# Patient Record
Sex: Female | Born: 1995 | Race: Black or African American | Hispanic: No | Marital: Single | Smoking: Never smoker
Health system: Southern US, Community
[De-identification: ages and names within clinical notes are randomized; demographics above are authoritative.]

## PROBLEM LIST (undated history)

## (undated) ENCOUNTER — Inpatient Hospital Stay: Payer: Self-pay

## (undated) DIAGNOSIS — F329 Major depressive disorder, single episode, unspecified: Secondary | ICD-10-CM

## (undated) DIAGNOSIS — F32A Depression, unspecified: Secondary | ICD-10-CM

---

## 2017-03-06 ENCOUNTER — Emergency Department: Payer: Self-pay

## 2017-03-06 ENCOUNTER — Inpatient Hospital Stay
Admission: EM | Admit: 2017-03-06 | Discharge: 2017-03-07 | Disposition: A | Discharge status: Home / Self Care | Payer: Self-pay | Attending: Obstetrics and Gynecology | Admitting: Obstetrics and Gynecology

## 2017-03-06 ENCOUNTER — Encounter: Payer: Self-pay | Admitting: Emergency Medicine

## 2017-03-06 DIAGNOSIS — Z3A19 19 weeks gestation of pregnancy: Secondary | ICD-10-CM | POA: Insufficient documentation

## 2017-03-06 DIAGNOSIS — N200 Calculus of kidney: Secondary | ICD-10-CM | POA: Insufficient documentation

## 2017-03-06 DIAGNOSIS — O26832 Pregnancy related renal disease, second trimester: Secondary | ICD-10-CM | POA: Insufficient documentation

## 2017-03-06 DIAGNOSIS — O24912 Unspecified diabetes mellitus in pregnancy, second trimester: Secondary | ICD-10-CM | POA: Insufficient documentation

## 2017-03-06 DIAGNOSIS — O26892 Other specified pregnancy related conditions, second trimester: Secondary | ICD-10-CM | POA: Insufficient documentation

## 2017-03-06 DIAGNOSIS — O26899 Other specified pregnancy related conditions, unspecified trimester: Secondary | ICD-10-CM

## 2017-03-06 DIAGNOSIS — Z87442 Personal history of urinary calculi: Secondary | ICD-10-CM | POA: Insufficient documentation

## 2017-03-06 DIAGNOSIS — O209 Hemorrhage in early pregnancy, unspecified: Secondary | ICD-10-CM | POA: Insufficient documentation

## 2017-03-06 DIAGNOSIS — O2342 Unspecified infection of urinary tract in pregnancy, second trimester: Secondary | ICD-10-CM | POA: Insufficient documentation

## 2017-03-06 DIAGNOSIS — R109 Unspecified abdominal pain: Secondary | ICD-10-CM

## 2017-03-06 LAB — HCG, QUANTITATIVE, PREGNANCY: HCG, BETA CHAIN, QUANT, S: 29485 m[IU]/mL — AB (ref <5)

## 2017-03-06 LAB — POCT PREGNANCY, URINE: PREG TEST UR: POSITIVE — AB

## 2017-03-06 LAB — CBC
HEMATOCRIT: 29.9 % — AB (ref 35.0–47.0)
Hemoglobin: 10.9 g/dL — ABNORMAL LOW (ref 12.0–16.0)
MCH: 31.6 pg (ref 26.0–34.0)
MCHC: 36.6 g/dL — AB (ref 32.0–36.0)
MCV: 86.4 fL (ref 80.0–100.0)
Platelets: 291 10*3/uL (ref 150–440)
RBC: 3.46 MIL/uL — ABNORMAL LOW (ref 3.80–5.20)
RDW: 15.8 % — AB (ref 11.5–14.5)
WBC: 11.2 10*3/uL — ABNORMAL HIGH (ref 3.6–11.0)

## 2017-03-06 LAB — ABO/RH: ABO/RH(D): A POS

## 2017-03-06 NOTE — ED Triage Notes (Signed)
Patient brought back from US

## 2017-03-06 NOTE — ED Triage Notes (Signed)
Pt G2P1. Unsure of how far along pt is. Just found out pregnant earlier this month.  Pt last period end January; per pregnancy wheel would be 25-26 weeks.  Pt reports US earlier this month put her at 16+ weeks.  One day vaginal bleeding last week and again today.  C/o abdominal pain last 3 days and bright red bleeding today.  Reports more than spotting and will have to hold tissue there when starts bleeding.  Spoke with L&D and they want US down here to verify how far along pregnancy is before coming up

## 2017-03-06 NOTE — ED Notes (Signed)
Pt now informs RN that she has not had sex before march this year and that she has a 498 month old baby at home.  Spoke with US and they will do US to make sure baby is not viable. No FHT at this time per dr Lenard Lancepaduchowski since will get US quickly.

## 2017-03-06 NOTE — ED Notes (Signed)
Called patient for treatment area without an answer. Called on five separate occasions without an answer.

## 2017-03-07 LAB — URINALYSIS, ROUTINE W REFLEX MICROSCOPIC
Bilirubin Urine: NEGATIVE
GLUCOSE, UA: NEGATIVE mg/dL
Hgb urine dipstick: NEGATIVE
Ketones, ur: NEGATIVE mg/dL
NITRITE: NEGATIVE
PH: 5 (ref 5.0–8.0)
Protein, ur: NEGATIVE mg/dL
Specific Gravity, Urine: 1.032 — ABNORMAL HIGH (ref 1.005–1.030)

## 2017-03-07 LAB — CHLAMYDIA/NGC RT PCR (ARMC ONLY)
Chlamydia Tr: NOT DETECTED
N GONORRHOEAE: NOT DETECTED

## 2017-03-07 NOTE — H&P (Signed)
Nichole Mcguire is a 21 y.o. female [id number]  with 1 prior US visit in [place] with EDD of  08/03/17  prior to moving here to [place] on 02/20/17  presenting for "soreness in the abdominal area starting on Thursday" and concerned that this is not normal. Pt has not had PNC at this point as she just got her Medicaid number this week but, no card yet. Pt denies any VB, LOF, UC's, back pain or any dysuria, urinary frequency, vaginal odor. Pt states she had "some vag spotting a week ago during urination" but, nothing since that time.  HPI: Starting Thurs, pt began to experience abd pain below the umbilicus and above the pubic hair line bilat. Rates the pain a 6/7 out of 10. Pt denies any recent STD S/S but, has had chlamydia x 1 in the past. No N,V,D,constipation. Has been eating and taking fluids like normal. Pain does not radiate anywhere and is localized bilat but, no lateralization. Pt has been afebrile and has not required any meds for the pain. No S/S of labor noted.   PMH: Kidney stone x 1, Chlamydia x 1 which was treated in the past AVW:UJWJPSH:None. Family History: family history includes Crohn's disease in her mother; Hypertension in her mother. Social History:  reports that she has never smoked. She has never used smokeless tobacco. She reports that she does not drink alcohol or use drugs. OB/GYN:G3P1011, 1 prior NSVD in [place](child is now 2 yrs old)   Maternal Diabetes: Has not been evaluated this pregnancy Genetic Screening: None to date Maternal Ultrasounds/Referrals: 03/06/17 showed: Mason JimSingleton, +cardiac activity 157, Ant placenta, no previa, AFI WNL, XB:JYNWGNCx:closed on US BPD: 4.3 cms @19  weeks, no ovarian measurements taken.  Fetal Ultrasounds or other Referrals: Per above Maternal Substance Abuse: Denies per pt Significant Maternal Medications: PNV Significant Maternal Lab Results: Urine: Ca oxylate crystals present, +mucus present,Hazy appeanrance, Rare bacteria, Neg bilirubin, Neg glucose, Neg Ketones, Mod  leukocytes, Neg nitrites, ph5, Protein neg, SG:1.032, WBC6-30  Other Comments:    Review of Systems  Constitutional: Negative.   HENT: Negative.   Eyes: Negative.   Respiratory: Negative.   Cardiovascular: Negative.   Musculoskeletal: Negative.   Skin: Negative.    History Blood pressure (!) 117/57, pulse 87, temperature 98.1 F (36.7 C), temperature source Oral, resp. rate 18, height 5\' 6"  (1.676 m), weight 65.3 kg (144 lb), last menstrual period 09/03/2016, SpO2 100 %. Exam Physical Exam  Gen: 21 yo black female in NAD. HEENT:Normocephalic, Eyes non-icteric,  Heart:S1S2, RRR, NO M/R/G. Lungs: CTA bilat, no W/R/R. Abd: FH is 18 cms, +Doppler 157 on US Pt in non-tender over the upper abd and the lower abd. There is no rigidity, no distention, Abd is soft and the only area of discomfort is where the baby is lying in the uterus. Pt does not react if you deeply palpate the areas but, states that is where is is sore. CVAT is neg bilaterally.  Extrems: No edema, Reflexes 1+/0. Vag exam deferred as pt has not had any LOF or VB this am and cx was closed on US. A:1. IUP at 19 weeks 2. Abdominal discomfort 3. UTI has not been ruled out 4. Calcium oxylate in the urine P:1. GC/CH sent. 2.Push po fluids 3. [facility] card. 4. FU here for wosening of S/S. Prenatal labs: ABO, Rh: --/--/A POS (07/28 1902) Antibody:  Unknown  Rubella:  Unknown  RPR:   Unknown  HBsAg:   Uknown  HIV:   Unknown  GBS:   Unknown    Sharee Pimplearon W Jones 03/07/2017, 3:22 AM

## 2017-03-07 NOTE — Progress Notes (Signed)
Pt arrived to [facility] triage unit in no acute pain, states she came to hospital to be evaluated for lower abdominal pain she has been having for the past 3 days. Says she is not currently having any vaginal bleeding or spotting. Reports having small amt bleeding like a period when she wiped after urinating, yesterday and last week. Denies pressure or back pain. Says she had not noticed any clots when bleeding was present. At this moment, pt insist she is here for lower abdominal pain and discomfort, rates 4/10, but tolerable. Pt denies taking any medication or doing anything to relieve discomfort. [place] and found out she was pregnant 2 days before she moved, [facility] [place], [facility]. LMP was at end of Jan, lasted 3 days. Exact start date unknown. Pt says she has not began to feel fetal movement yet. FHR doppler/tracing 155. Toco applied, abdomen palpates soft, no ctx tracing. Pt unable to provide urine sample, will obtain when next she needs to void.

## 2017-03-07 NOTE — Discharge Instructions (Signed)
Dietary Guidelines to Help Prevent Kidney Stones Kidney stones are deposits of minerals and salts that form inside your kidneys. Your risk of developing kidney stones may be greater depending on your diet, your lifestyle, the medicines you take, and whether you have certain medical conditions. Most people can reduce their chances of developing kidney stones by following the instructions below. Depending on your overall health and the type of kidney stones you tend to develop, your dietitian may give you more specific instructions. What are tips for following this plan? Reading food labels   Choose foods with "no salt added" or "low-salt" labels. Limit your sodium intake to less than 1500 mg per day.  Choose foods with calcium for each meal and snack. Try to eat about 300 mg of calcium at each meal. Foods that contain 200-500 mg of calcium per serving include:  8 oz (237 ml) of milk, fortified nondairy milk, and fortified fruit juice.  8 oz (237 ml) of kefir, yogurt, and soy yogurt.  4 oz (118 ml) of tofu.  1 oz of cheese.  1 cup (300 g) of dried figs.  1 cup (91 g) of cooked broccoli.  1-3 oz can of sardines or mackerel.  Most people need 1000 to 1500 mg of calcium each day. Talk to your dietitian about how much calcium is recommended for you. Shopping   Buy plenty of fresh fruits and vegetables. Most people do not need to avoid fruits and vegetables, even if they contain nutrients that may contribute to kidney stones.  When shopping for convenience foods, choose:  Whole pieces of fruit.  Premade salads with dressing on the side.  Low-fat fruit and yogurt smoothies.  Avoid buying frozen meals or prepared deli foods.  Look for foods with live cultures, such as yogurt and kefir. Cooking   Do not add salt to food when cooking. Place a salt shaker on the table and allow each person to add his or her own salt to taste.  Use vegetable protein, such as beans, textured vegetable  protein (TVP), or tofu instead of meat in pasta, casseroles, and soups. Meal planning   Eat less salt, if told by your dietitian. To do this:  Avoid eating processed or premade food.  Avoid eating fast food.  Eat less animal protein, including cheese, meat, poultry, or fish, if told by your dietitian. To do this:  Limit the number of times you have meat, poultry, fish, or cheese each week. Eat a diet free of meat at least 2 days a week.  Eat only one serving each day of meat, poultry, fish, or seafood.  When you prepare animal protein, cut pieces into small portion sizes. For most meat and fish, one serving is about the size of one deck of cards.  Eat at least 5 servings of fresh fruits and vegetables each day. To do this:  Keep fruits and vegetables on hand for snacks.  Eat 1 piece of fruit or a handful of berries with breakfast.  Have a salad and fruit at lunch.  Have two kinds of vegetables at dinner.  Limit foods that are high in a substance called oxalate. These include:  Spinach.  Rhubarb.  Beets.  Potato chips and french fries.  Nuts.  If you regularly take a diuretic medicine, make sure to eat at least 1-2 fruits or vegetables high in potassium each day. These include:  Avocado.  Banana.  Orange, prune, carrot, or tomato juice.  Baked potato.  Cabbage.    Beans and split peas. General instructions   Drink enough fluid to keep your urine clear or pale yellow. This is the most important thing you can do.  Talk to your health care provider and dietitian about taking daily supplements. Depending on your health and the cause of your kidney stones, you may be advised:  Not to take supplements with vitamin C.  To take a calcium supplement.  To take a daily probiotic supplement.  To take other supplements such as magnesium, fish oil, or vitamin B6.  Take all medicines and supplements as told by your health care provider.  Limit alcohol intake to no  more than 1 drink a day for nonpregnant women and 2 drinks a day for men. One drink equals 12 oz of beer, 5 oz of wine, or 1 oz of hard liquor.  Lose weight if told by your health care provider. Work with your dietitian to find strategies and an eating plan that works best for you. What foods are not recommended? Limit your intake of the following foods, or as told by your dietitian. Talk to your dietitian about specific foods you should avoid based on the type of kidney stones and your overall health. Grains  Breads. Bagels. Rolls. Baked goods. Salted crackers. Cereal. Pasta. Vegetables  Spinach. Rhubarb. Beets. Canned vegetables. Pickles. Olives. Meats and other protein foods  Nuts. Nut butters. Large portions of meat, poultry, or fish. Salted or cured meats. Deli meats. Hot dogs. Sausages. Dairy  Cheese. Beverages  Regular soft drinks. Regular vegetable juice. Seasonings and other foods  Seasoning blends with salt. Salad dressings. Canned soups. Soy sauce. Ketchup. Barbecue sauce. Canned pasta sauce. Casseroles. Pizza. Lasagna. Frozen meals. Potato chips. French fries. Summary  You can reduce your risk of kidney stones by making changes to your diet.  The most important thing you can do is drink enough fluid. You should drink enough fluid to keep your urine clear or pale yellow.  Ask your health care provider or dietitian how much protein from animal sources you should eat each day, and also how much salt and calcium you should have each day. This information is not intended to replace advice given to you by your health care provider. Make sure you discuss any questions you have with your health care provider. Document Released: 11/21/2010 Document Revised: 07/07/2016 Document Reviewed: 07/07/2016 Elsevier Interactive Patient Education  2017 Elsevier Inc.  

## 2017-03-07 NOTE — Progress Notes (Signed)
Beatriz Stallion. Jones, CNM at bedside to see and assess pt.

## 2017-03-07 NOTE — Discharge Summary (Signed)
Obstetric Discharge Summary Reason for Admission: Abdominal pain  Prenatal Procedures: none Intrapartum Procedures: N/A Postpartum Procedures: none Complications-Operative and Postpartum: N/A Hemoglobin  Date Value Ref Range Status  03/06/2017 10.9 (L) 12.0 - 16.0 g/dL Final   HCT  Date Value Ref Range Status  03/06/2017 29.9 (L) 35.0 - 47.0 % Final    Physical Exam:  General: alert, cooperative and appears stated age 15Lochia: None Uterine Fundus: Gravid Incision: N/A Neg Homans  Discharge Diagnoses: IUP at 19 weeks with kidney stones  Discharge Information: Date: 03/07/2017 Activity: Up ad lib Diet: diet modification to include kidney stones and pushing po water Medications: PNV Condition: Stable  Instructions[facility] Discharge to: Home   Call [phone] On Monday for an appointment with Nichole Mcguire, CNM (Midwife) and set up a new OB appt. Tell them that she has already been seen by us at the hospital and that we will take her for care.  Newborn Data: This patient has no babies on file.   Nichole Mcguire 03/07/2017, 4:23 AM

## 2017-03-07 NOTE — OB Triage Note (Signed)
Drink plenty fluids, rest, continue taking prenatal vitamins, avoid heavy lifting or strenuous activities. Scheduled an appointment with OBGYN to be seen as soon as has medicaid card. Follow recommend diet to prevent kidney stones. Return to nearest hospital emergency department if having any heavy vaginal bleeding, clots, worsening lower abdominal pain, pressure or cramping. Understanding verbalized.

## 2017-03-07 NOTE — Progress Notes (Signed)
Nichole Mcguire. Jones, CNM paged, call returned, made aware of pt arrival to BP with c/o lower abd pain, urine sample sent for UA, reviewed results, Yetta Barre. Jones, CNM to come see pt. No additional orders received.

## 2017-04-20 LAB — OB RESULTS CONSOLE HIV ANTIBODY (ROUTINE TESTING): HIV: NONREACTIVE

## 2017-04-21 LAB — OB RESULTS CONSOLE VARICELLA ZOSTER ANTIBODY, IGG: VARICELLA IGG: IMMUNE

## 2017-04-21 LAB — OB RESULTS CONSOLE RPR: RPR: NONREACTIVE

## 2017-04-21 LAB — OB RESULTS CONSOLE HEPATITIS B SURFACE ANTIGEN: HEP B S AG: NEGATIVE

## 2017-04-21 LAB — OB RESULTS CONSOLE GC/CHLAMYDIA
Chlamydia: NEGATIVE
Gonorrhea: NEGATIVE

## 2017-04-21 LAB — OB RESULTS CONSOLE RUBELLA ANTIBODY, IGM: RUBELLA: IMMUNE

## 2017-07-12 LAB — OB RESULTS CONSOLE GBS: STREP GROUP B AG: NEGATIVE

## 2017-07-13 LAB — OB RESULTS CONSOLE HIV ANTIBODY (ROUTINE TESTING): HIV: NONREACTIVE

## 2017-07-15 LAB — OB RESULTS CONSOLE GC/CHLAMYDIA
Chlamydia: NEGATIVE
Gonorrhea: NEGATIVE

## 2017-08-05 ENCOUNTER — Other Ambulatory Visit: Payer: Self-pay | Admitting: Obstetrics and Gynecology

## 2017-08-10 ENCOUNTER — Inpatient Hospital Stay
Admission: EM | Admit: 2017-08-10 | Discharge: 2017-08-12 | DRG: 806 | Disposition: A | Discharge status: Home / Self Care | Payer: Medicaid Other | Attending: Obstetrics & Gynecology | Admitting: Obstetrics & Gynecology

## 2017-08-10 ENCOUNTER — Other Ambulatory Visit: Payer: Self-pay

## 2017-08-10 DIAGNOSIS — D649 Anemia, unspecified: Secondary | ICD-10-CM | POA: Diagnosis present

## 2017-08-10 DIAGNOSIS — O9902 Anemia complicating childbirth: Secondary | ICD-10-CM | POA: Diagnosis present

## 2017-08-10 DIAGNOSIS — Z3A41 41 weeks gestation of pregnancy: Secondary | ICD-10-CM | POA: Diagnosis not present

## 2017-08-10 DIAGNOSIS — Z3483 Encounter for supervision of other normal pregnancy, third trimester: Secondary | ICD-10-CM | POA: Diagnosis present

## 2017-08-10 LAB — CBC
HEMATOCRIT: 31.1 % — AB (ref 35.0–47.0)
HEMOGLOBIN: 11.2 g/dL — AB (ref 12.0–16.0)
MCH: 32.9 pg (ref 26.0–34.0)
MCHC: 36.2 g/dL — AB (ref 32.0–36.0)
MCV: 90.9 fL (ref 80.0–100.0)
Platelets: 281 10*3/uL (ref 150–440)
RBC: 3.42 MIL/uL — AB (ref 3.80–5.20)
RDW: 16.8 % — ABNORMAL HIGH (ref 11.5–14.5)
WBC: 15.1 10*3/uL — ABNORMAL HIGH (ref 3.6–11.0)

## 2017-08-10 LAB — COMPREHENSIVE METABOLIC PANEL
ALBUMIN: 3.3 g/dL — AB (ref 3.5–5.0)
ALK PHOS: 74 U/L (ref 38–126)
ALT: 16 U/L (ref 14–54)
ANION GAP: 10 (ref 5–15)
AST: 25 U/L (ref 15–41)
BILIRUBIN TOTAL: 4 mg/dL — AB (ref 0.3–1.2)
BUN: 9 mg/dL (ref 6–20)
CALCIUM: 8.8 mg/dL — AB (ref 8.9–10.3)
CO2: 20 mmol/L — AB (ref 22–32)
CREATININE: 0.61 mg/dL (ref 0.44–1.00)
Chloride: 108 mmol/L (ref 101–111)
GFR calc Af Amer: >60 mL/min (ref >60)
GFR calc non Af Amer: >60 mL/min (ref >60)
GLUCOSE: 71 mg/dL (ref 65–99)
Potassium: 3.5 mmol/L (ref 3.5–5.1)
SODIUM: 138 mmol/L (ref 135–145)
TOTAL PROTEIN: 6.9 g/dL (ref 6.5–8.1)

## 2017-08-10 LAB — TYPE AND SCREEN
ABO/RH(D): A POS
Antibody Screen: NEGATIVE

## 2017-08-10 MED ORDER — SOD CITRATE-CITRIC ACID 500-334 MG/5ML PO SOLN: 30.0000 mL | ORAL | Status: DC | PRN

## 2017-08-10 MED ORDER — TERBUTALINE SULFATE 1 MG/ML IJ SOLN: 0.2500 mg | Freq: Once | INTRAMUSCULAR | Status: DC | PRN

## 2017-08-10 MED ORDER — MISOPROSTOL 25 MCG QUARTER TABLET
25.0000 ug | ORAL_TABLET | ORAL | Status: DC
  Administered 2017-08-10: 25 ug via ORAL
  Filled 2017-08-10 (×2): qty 1

## 2017-08-10 MED ORDER — LIDOCAINE HCL (PF) 1 % IJ SOLN: 30.0000 mL | INTRAMUSCULAR | Status: DC | PRN

## 2017-08-10 MED ORDER — LACTATED RINGERS IV SOLN
500.0000 mL | INTRAVENOUS | Status: DC | PRN
  Administered 2017-08-11: 250 mL via INTRAVENOUS

## 2017-08-10 MED ORDER — OXYTOCIN BOLUS FROM INFUSION
500.0000 mL | Freq: Once | INTRAVENOUS | Status: AC
  Administered 2017-08-11: 500 mL via INTRAVENOUS

## 2017-08-10 MED ORDER — ONDANSETRON HCL 4 MG/2ML IJ SOLN
4.0000 mg | Freq: Four times a day (QID) | INTRAMUSCULAR | Status: DC | PRN
  Administered 2017-08-11: 4 mg via INTRAVENOUS
  Filled 2017-08-10 (×2): qty 2

## 2017-08-10 MED ORDER — SODIUM CHLORIDE 0.9 % IV SOLN: 2.0000 g | Freq: Once | INTRAVENOUS | Status: DC

## 2017-08-10 MED ORDER — OXYTOCIN 40 UNITS IN LACTATED RINGERS INFUSION - SIMPLE MED
2.5000 [IU]/h | INTRAVENOUS | Status: DC
  Administered 2017-08-11: 2.5 [IU]/h via INTRAVENOUS
  Filled 2017-08-10: qty 1000

## 2017-08-10 MED ORDER — LACTATED RINGERS IV SOLN
INTRAVENOUS | Status: DC
  Administered 2017-08-10 – 2017-08-11 (×3): via INTRAVENOUS

## 2017-08-10 MED ORDER — MISOPROSTOL 25 MCG QUARTER TABLET
25.0000 ug | ORAL_TABLET | ORAL | Status: DC | PRN
  Filled 2017-08-10: qty 1

## 2017-08-10 MED ORDER — BUTORPHANOL TARTRATE 1 MG/ML IJ SOLN: 1.0000 mg | INTRAMUSCULAR | Status: DC | PRN

## 2017-08-10 MED ORDER — OXYTOCIN 40 UNITS IN LACTATED RINGERS INFUSION - SIMPLE MED
1.0000 m[IU]/min | INTRAVENOUS | Status: DC
  Administered 2017-08-11: 2 m[IU]/min via INTRAVENOUS

## 2017-08-10 MED ORDER — ACETAMINOPHEN 325 MG PO TABS
650.0000 mg | ORAL_TABLET | ORAL | Status: DC | PRN
  Administered 2017-08-11 – 2017-08-12 (×2): 650 mg via ORAL
  Filled 2017-08-10 (×2): qty 2

## 2017-08-10 NOTE — L&D Delivery Note (Signed)
Date of delivery: 08/11/17 Estimated Date of Delivery: 08/03/17 Patient's last menstrual period was 09/03/2016. EGA: 267w1d  Delivery Note At 11:32 AM a viable female was delivered via Vaginal, Spontaneous (Presentation: LOA, cephalic  ).  APGAR: 7, 9; weight 8 lb 8.5 oz (3870 g).   Placenta status: spontaneous, intact.  Cord: 3vv with the following complications:  None apparent.  Cord pH: not sent  Anesthesia:  epidural Episiotomy: None Lacerations: None Suture Repair: n'a Est. Blood Loss (mL): 755cc measured  Mom to postpartum.  Baby to Couplet care / Skin to Skin.  Mom presented to L&D with contractions, but low bishop score   cytotec placed x 1. SrOM for meconium. epidual placed. Augmented with pitocin. Recurrent deep variables noted and Amnioinfusion performed. Progressed to complete, second stage: <30 mins.  delivery of fetal head with restitution to LOT.   Anterior then posterior shoulders delivered without difficulty.  Baby placed on mom's chest, and attended to by peds.  We sang happy birthday to baby Nichole Mcguire.  Cord was then clamped and cut after <60sec.   Placenta spontaneously delivered, intact.   IV pitocin given for hemorrhage prophylaxis. Brisk bleeding encountered, determined to be from uterus; no vaginal tears, cervix was intact.  Methergine IM and Cytotec given (200 buccally, 800 rectally). Bleeding slowed to expected rate.    ----- Ranae Plumberhelsea Ward, MD Attending Obstetrician and Gynecologist [facility], Department of OB/GYN [facility]

## 2017-08-10 NOTE — H&P (Signed)
Update H&P  Patient presents with painful contractions, due to be induced tomorrow   2/50/-3 per RN  Fhr: 150-170 mod + accels + small short vriabkes, category 2 Toco: q 3-5 min  BP 114/72 (BP Location: Left Arm)   Pulse 79   Temp (!) 97.4 F (36.3 C) (Oral)   Resp 16   Ht 5\' 6"  (1.676 m)   Wt 82.1 kg (181 lb)   LMP 09/03/2016   BMI 29.21 kg/m   -cytotec 25mcg buccally  q4h prn -pitocin after cytotec prn -will confirm GBS status  If positive, will start abx  -admit to L&D   Please refer to Andris BaumannNM Jones' already written H&P for details

## 2017-08-10 NOTE — OB Triage Note (Signed)
Pt states CTX started 08/09/18 and have become more intense. Pt states some spotting but denies LOF. Positive Fetal movement. Fetal monitors applied assessing.

## 2017-08-10 NOTE — Progress Notes (Signed)
Spoke with provider, Dr. Elesa MassedWard, at [phone] with patient report. Reported patient contraction pattern, tachy heart rate from the 170s to 180s, with variable and deceleration @1939 . Ask provider to review strip and discussed plan of care. Will continue to monitor and update provider as needed.

## 2017-08-11 ENCOUNTER — Inpatient Hospital Stay: Payer: Medicaid Other | Admitting: Anesthesiology

## 2017-08-11 MED ORDER — MISOPROSTOL 200 MCG PO TABS
ORAL_TABLET | ORAL | Status: AC
  Filled 2017-08-11: qty 1

## 2017-08-11 MED ORDER — DIPHENHYDRAMINE HCL 25 MG PO CAPS: 25.0000 mg | ORAL_CAPSULE | Freq: Four times a day (QID) | ORAL | Status: DC | PRN

## 2017-08-11 MED ORDER — PHENYLEPHRINE 40 MCG/ML (10ML) SYRINGE FOR IV PUSH (FOR BLOOD PRESSURE SUPPORT)
80.0000 ug | PREFILLED_SYRINGE | INTRAVENOUS | Status: DC | PRN
  Filled 2017-08-11: qty 5

## 2017-08-11 MED ORDER — FENTANYL 2.5 MCG/ML W/ROPIVACAINE 0.15% IN NS 100 ML EPIDURAL (ARMC)
EPIDURAL | Status: AC
  Filled 2017-08-11: qty 100

## 2017-08-11 MED ORDER — MISOPROSTOL 200 MCG PO TABS
200.0000 ug | ORAL_TABLET | Freq: Once | ORAL | Status: AC
  Administered 2017-08-11: 200 ug via BUCCAL

## 2017-08-11 MED ORDER — COCONUT OIL OIL: 1.0000 "application " | TOPICAL_OIL | Status: DC | PRN

## 2017-08-11 MED ORDER — DOCUSATE SODIUM 100 MG PO CAPS
100.0000 mg | ORAL_CAPSULE | Freq: Two times a day (BID) | ORAL | Status: DC
  Administered 2017-08-12 (×2): 100 mg via ORAL
  Filled 2017-08-11 (×2): qty 1

## 2017-08-11 MED ORDER — DIPHENHYDRAMINE HCL 50 MG/ML IJ SOLN: 12.5000 mg | INTRAMUSCULAR | Status: DC | PRN

## 2017-08-11 MED ORDER — ONDANSETRON HCL 4 MG/2ML IJ SOLN: 4.0000 mg | INTRAMUSCULAR | Status: DC | PRN

## 2017-08-11 MED ORDER — SODIUM CHLORIDE 0.9 % IV SOLN
INTRAVENOUS | Status: DC | PRN
  Administered 2017-08-11 (×3): 5 mL via EPIDURAL

## 2017-08-11 MED ORDER — WITCH HAZEL-GLYCERIN EX PADS: 1.0000 "application " | MEDICATED_PAD | CUTANEOUS | Status: DC

## 2017-08-11 MED ORDER — LACTATED RINGERS IV SOLN: 500.0000 mL | Freq: Once | INTRAVENOUS | Status: DC

## 2017-08-11 MED ORDER — BENZOCAINE-MENTHOL 20-0.5 % EX AERO: 1.0000 "application " | INHALATION_SPRAY | CUTANEOUS | Status: DC | PRN

## 2017-08-11 MED ORDER — LIDOCAINE HCL (PF) 1 % IJ SOLN
INTRAMUSCULAR | Status: AC
  Filled 2017-08-11: qty 30

## 2017-08-11 MED ORDER — EPHEDRINE 5 MG/ML INJ
10.0000 mg | INTRAVENOUS | Status: DC | PRN
  Filled 2017-08-11: qty 2

## 2017-08-11 MED ORDER — IBUPROFEN 600 MG PO TABS
600.0000 mg | ORAL_TABLET | Freq: Four times a day (QID) | ORAL | Status: DC
  Administered 2017-08-11 – 2017-08-12 (×3): 600 mg via ORAL
  Filled 2017-08-11 (×3): qty 1

## 2017-08-11 MED ORDER — LACTATED RINGERS IV SOLN
INTRAVENOUS | Status: DC
  Administered 2017-08-11: 06:00:00 via INTRAUTERINE

## 2017-08-11 MED ORDER — OXYTOCIN 10 UNIT/ML IJ SOLN
INTRAMUSCULAR | Status: AC
  Filled 2017-08-11: qty 2

## 2017-08-11 MED ORDER — PRENATAL MULTIVITAMIN CH
1.0000 | ORAL_TABLET | Freq: Every day | ORAL | Status: DC
  Administered 2017-08-11 – 2017-08-12 (×2): 1 via ORAL
  Filled 2017-08-11 (×2): qty 1

## 2017-08-11 MED ORDER — ONDANSETRON HCL 4 MG PO TABS: 4.0000 mg | ORAL_TABLET | ORAL | Status: DC | PRN

## 2017-08-11 MED ORDER — AMMONIA AROMATIC IN INHA
RESPIRATORY_TRACT | Status: AC
  Filled 2017-08-11: qty 10

## 2017-08-11 MED ORDER — MISOPROSTOL 200 MCG PO TABS
ORAL_TABLET | ORAL | Status: AC
  Administered 2017-08-11: 800 ug
  Filled 2017-08-11: qty 4

## 2017-08-11 MED ORDER — ACETAMINOPHEN 500 MG PO TABS
1000.0000 mg | ORAL_TABLET | Freq: Four times a day (QID) | ORAL | Status: DC | PRN
  Administered 2017-08-11 – 2017-08-12 (×2): 1000 mg via ORAL
  Filled 2017-08-11 (×2): qty 2

## 2017-08-11 MED ORDER — METHYLERGONOVINE MALEATE 0.2 MG/ML IJ SOLN
INTRAMUSCULAR | Status: AC
  Administered 2017-08-11: 0.2 mg
  Filled 2017-08-11: qty 1

## 2017-08-11 MED ORDER — LIDOCAINE HCL (PF) 1 % IJ SOLN
INTRAMUSCULAR | Status: DC | PRN
  Administered 2017-08-11: 3 mL

## 2017-08-11 MED ORDER — SIMETHICONE 80 MG PO CHEW: 80.0000 mg | CHEWABLE_TABLET | ORAL | Status: DC | PRN

## 2017-08-11 MED ORDER — FENTANYL 2.5 MCG/ML W/ROPIVACAINE 0.15% IN NS 100 ML EPIDURAL (ARMC)
12.0000 mL/h | EPIDURAL | Status: DC
  Administered 2017-08-11 (×2): 12 mL/h via EPIDURAL

## 2017-08-11 MED ORDER — DIBUCAINE 1 % RE OINT: 1.0000 "application " | TOPICAL_OINTMENT | RECTAL | Status: DC | PRN

## 2017-08-11 NOTE — Progress Notes (Signed)
Intrapartum progress note  Variables improved with amnioinfusion.  Currently at 50cc, may increase PRN.  MVu average 161 over 10 mins, increase pitocin to 3mu

## 2017-08-11 NOTE — Discharge Summary (Signed)
Obstetrical Discharge Summary  Patient Name: Nichole Mcguire DOB: [birth year] MRN: [id number]  Date of Admission: 08/10/2017 Nichole Mcguire of Delivery: 08/11/17 Delivered by: Nichole Mcguire Date of Discharge: 08/12/2017  Primary OB: [facility]gu[facility] IHK:[id number]:Patient's last menstrual period was 09/03/2016. EDC Estimated Date of Delivery: 08/03/17 Gestational Age at Delivery: 5672w1d   Antepartum complications: Admitting Diagnosis:  Secondary Diagnosis: Patient Active Problem List   Diagnosis Date Noted  . Indication for care in labor and delivery, antepartum 08/10/2017    Augmentation: Pitocin and Cytotec Complications: None Intrapartum complications/course: variables with amnioinfusion Date of Delivery: 08/11/17 Delivered By: Nichole Mcguire Delivery Type: spontaneous vaginal delivery Anesthesia: epidural Placenta: spontaneous Laceration: none Episiotomy: none Newborn Data: Live born female  Birth Weight: 8 lb 8.5 oz (3870 g) APGAR: 7, 9  Newborn Delivery   Birth date/time:  08/11/2017 11:32:00 Delivery type:  Vaginal, Spontaneous       Postpartum Procedures: Methergine and Cytotec given at delivery for immediate PP bleeding.  Post partum course:  Patient had an uncomplicated postpartum course.  By time of discharge on PPD#1, her pain was controlled on oral pain medications; she had appropriate lochia and was ambulating, voiding without difficulty and tolerating regular diet.  She was deemed stable for discharge to home.    Discharge Physical Exam:  BP 104/65 (BP Location: Left Arm)   Pulse 74   Temp 97.6 F (36.4 C) (Oral)   Resp 18   Ht 5\' 6"  (1.676 m)   Wt 181 lb (82.1 kg)   LMP 09/03/2016   SpO2 100%   Breastfeeding? Unknown   BMI 29.21 kg/m   General: NAD CV: RRR Pulm: CTABL, nl effort ABD: s/nd/nt, fundus firm and below the umbilicus Lochia: small DVT Evaluation: LE non-ttp, no evidence of DVT on exam.  Hemoglobin  Date Value Ref Range Status  08/12/2017 9.6 (L) 12.0  - 16.0 g/dL Final   HCT  Date Value Ref Range Status  08/12/2017 27.3 (L) 35.0 - 47.0 % Final     Disposition: stable, discharge to home. Baby Feeding: formula Baby Disposition: home with mom  Rh Immune globulin given: n/a Rubella vaccine given: n/a Tdap vaccine given in AP or PP setting: AP 05/18/17 Flu vaccine given in AP or PP setting:  AP 05/18/17 Contraception: plans Nexplanon  Prenatal Labs: A Pos/Ab Neg/RPR non-reactive/Varicella Immune/Rubella Immune/ Hep B negative/HIV negative GBS negative/ G/C negative   Plan:  Nichole Mcguire was discharged to home in good condition. Follow-up appointment with delivering provider in 6 weeks.  Discharge Medications: Allergies as of 08/12/2017      Reactions   Other Anaphylaxis   Pineappple, mushroom, kiwi      Medication List    TAKE these medications   acetaminophen 500 MG tablet Commonly known as:  TYLENOL Take 2 tablets (1,000 mg total) by mouth every 6 (six) hours as needed (for pain scale < 4).   ibuprofen 600 MG tablet Commonly known as:  ADVIL,MOTRIN Take 1 tablet (600 mg total) by mouth every 6 (six) hours.   PRENATAL VITAMIN PO Take by mouth.         Signed: McVey, REBECCA A, CNM

## 2017-08-11 NOTE — Anesthesia Procedure Notes (Signed)
Epidural Patient location during procedure: OB Start time: 08/11/2017 1:03 AM End time: 08/11/2017 1:23 AM  Staffing Anesthesiologist: Alver FisherPenwarden, Takerra Lupinacci, MD Performed: anesthesiologist   Preanesthetic Checklist Completed: patient identified, site marked, surgical consent, pre-op evaluation, timeout performed, IV checked, risks and benefits discussed and monitors and equipment checked  Epidural Patient position: sitting Prep: ChloraPrep Patient monitoring: heart rate, continuous pulse ox and blood pressure Approach: midline Location: L3-L4 Injection technique: LOR saline  Needle:  Needle type: Tuohy  Needle gauge: 18 G Needle length: 9 cm and 9 Needle insertion depth: 6 cm Catheter type: closed end flexible Catheter size: 20 Guage Catheter at skin depth: 10 cm Test dose: negative (0.125% bupivacaine)  Assessment Events: blood not aspirated, injection not painful, no injection resistance, negative IV test and no paresthesia  Additional Notes   Patient tolerated the insertion well without complications.Reason for block:procedure for pain

## 2017-08-11 NOTE — Progress Notes (Signed)
Intrapartum progress note  S: a little nauseous, but comfortable, s/p epidural Intrapartum events thus far: -cytotec placed, buccally x1 -epidural placed at 1:30am -spontaneous rupture 2am -pitocin started 4:45am  O: BP (!) 111/57   Pulse (!) 102   Temp 98.5 F (36.9 C) (Oral)   Resp 16   Ht 5\' 6"  (1.676 m)   Wt 82.1 kg (181 lb)   LMP 09/03/2016   SpO2 100%   BMI 29.21 kg/m   SVE: 7/80/-1, bloody show, IUPC placed TOCO: q 2-3 min, pit at 2mu, mVu: 175 in first 10 min FHT: 150 mod with recurrent variables following contractions  Amnioinfusion started, 200cc bolus, 50cc gtt.  A/P: 21yo G2P1001 @ 41+1 with labor:  1. Stalled labor:  Augmenting with pitocin, will determine adequacy after 30 minutes of IUPC and titrate pitocin PRN. 2. Category 2 tracing: recurrent variables, following contractions.  Amnioinfusion started, will see if these are lates that have a cord component or if there is improvement with the added fluid.   3. Continue close observation.  ----- Ranae Plumberhelsea Ward, MD Attending Obstetrician and Gynecologist [facility], Department of OB/GYN [facility]

## 2017-08-11 NOTE — Anesthesia Preprocedure Evaluation (Signed)
Anesthesia Evaluation  Patient identified by MRN, date of birth, ID band Patient awake    Reviewed: Allergy & Precautions, NPO status , Patient's Chart, lab work & pertinent test results  History of Anesthesia Complications Negative for: history of anesthetic complications  Airway Mallampati: III  TM Distance: >3 FB Neck ROM: Full    Dental no notable dental hx.    Pulmonary neg pulmonary ROS, neg sleep apnea, neg COPD,    breath sounds clear to auscultation- rhonchi (-) wheezing      Cardiovascular Exercise Tolerance: Good (-) hypertension(-) CAD and (-) Past MI  Rhythm:Regular Rate:Normal - Systolic murmurs and - Diastolic murmurs    Neuro/Psych negative neurological ROS  negative psych ROS   GI/Hepatic negative GI ROS, Neg liver ROS,   Endo/Other  negative endocrine ROSneg diabetes  Renal/GU negative Renal ROS     Musculoskeletal negative musculoskeletal ROS (+)   Abdominal (+) - obese, Gravid abdomen  Peds  Hematology negative hematology ROS (+)   Anesthesia Other Findings   Reproductive/Obstetrics (+) Pregnancy                             Lab Results  Component Value Date   WBC 15.1 (H) 08/10/2017   HGB 11.2 (L) 08/10/2017   HCT 31.1 (L) 08/10/2017   MCV 90.9 08/10/2017   PLT 281 08/10/2017    Anesthesia Physical Anesthesia Plan  ASA: II  Anesthesia Plan: Epidural   Post-op Pain Management:    Induction:   PONV Risk Score and Plan: 2  Airway Management Planned:   Additional Equipment:   Intra-op Plan:   Post-operative Plan:   Informed Consent: I have reviewed the patients History and Physical, chart, labs and discussed the procedure including the risks, benefits and alternatives for the proposed anesthesia with the patient or authorized representative who has indicated his/her understanding and acceptance.     Plan Discussed with: CRNA and  Anesthesiologist  Anesthesia Plan Comments: (Plan for epidural for labor, discussed epidural vs spinal vs GA if need for csection)        Anesthesia Quick Evaluation

## 2017-08-12 LAB — CBC
HCT: 27.3 % — ABNORMAL LOW (ref 35.0–47.0)
HEMOGLOBIN: 9.6 g/dL — AB (ref 12.0–16.0)
MCH: 31.8 pg (ref 26.0–34.0)
MCHC: 35.1 g/dL (ref 32.0–36.0)
MCV: 90.6 fL (ref 80.0–100.0)
Platelets: 235 10*3/uL (ref 150–440)
RBC: 3.01 MIL/uL — ABNORMAL LOW (ref 3.80–5.20)
RDW: 17.3 % — AB (ref 11.5–14.5)
WBC: 17.6 10*3/uL — ABNORMAL HIGH (ref 3.6–11.0)

## 2017-08-12 LAB — RPR: RPR Ser Ql: NONREACTIVE

## 2017-08-12 MED ORDER — IBUPROFEN 600 MG PO TABS
600.0000 mg | ORAL_TABLET | Freq: Four times a day (QID) | ORAL | Status: DC
  Administered 2017-08-12 (×2): 600 mg via ORAL
  Filled 2017-08-12 (×2): qty 1

## 2017-08-12 MED ORDER — IBUPROFEN 600 MG PO TABS: 600.0000 mg | ORAL_TABLET | Freq: Four times a day (QID) | ORAL | 0 refills | Status: AC

## 2017-08-12 MED ORDER — ACETAMINOPHEN 500 MG PO TABS: 1000.0000 mg | ORAL_TABLET | Freq: Four times a day (QID) | ORAL | 0 refills | Status: AC | PRN

## 2017-08-12 NOTE — Final Progress Note (Signed)
Post Partum Day 1 Subjective: Doing well, no complaints.  Tolerating regular diet, pain with PO meds, voiding and ambulating without difficulty.  No CP SOB Fever,Chills, N/V or leg pain; denies nipple or breast pain no HA change of vision, RUQ/epigastric pain  Objective: BP 104/65 (BP Location: Left Arm)   Pulse 74   Temp 97.6 F (36.4 C) (Oral)   Resp 18   Ht 5\' 6"  (1.676 m)   Wt 181 lb (82.1 kg)   LMP 09/03/2016   SpO2 100%   Breastfeeding? Unknown   BMI 29.21 kg/m    Physical Exam:  General: NAD; afebrile since 1/2 at 1520 Breasts: soft/nontender, formula feeding CV: RRR Pulm: nl effort, CTABL Abdomen: soft, NT, BS x 4 Perineum: minimal edema, intact Lochia: moderate Uterine Fundus: fundus firm and 2 fb below umbilicus DVT Evaluation: no cords, ttp LEs   Recent Labs    08/10/17 1908 08/12/17 0543  HGB 11.2* 9.6*  HCT 31.1* 27.3*  WBC 15.1* 17.6*  PLT 281 235    Assessment/Plan: 22 y.o. G2P1002 postpartum day # 1  - Continue routine PP care - encouraged snug fitting bra and cabbage leaves for bottlefeeding.  - Discussed contraceptive options including implant, IUDs hormonal and non-hormonal, injection, pills/ring/patch, condoms, and NFP.- [facility]   - mild  anemia - hemodynamically stable and asymptomatic; continue PNV x 6 weeks.  - Immunization status:  all Imms up to date    Disposition: Desires Dc home today if infant will be DC.     Nichole Mcguire A, CNM 08/12/17 9:52 AM

## 2017-08-12 NOTE — Anesthesia Postprocedure Evaluation (Signed)
Anesthesia Post Note  Patient: Nichole Mcguire  Procedure(s) Performed: AN AD HOC LABOR EPIDURAL  Patient location during evaluation: Mother Baby Anesthesia Type: Epidural Level of consciousness: awake, awake and alert and oriented Pain management: pain level controlled Vital Signs Assessment: post-procedure vital signs reviewed and stable Respiratory status: spontaneous breathing Cardiovascular status: blood pressure returned to baseline Postop Assessment: no headache, no backache, no apparent nausea or vomiting and adequate PO intake Anesthetic complications: no     Last Vitals:  Vitals:   08/12/17 0058 08/12/17 0525  BP: 104/62 108/63  Pulse: 81 79  Resp: 17 16  Temp: 36.4 C (!) 36.4 C  SpO2: 100% 100%    Last Pain:  Vitals:   08/12/17 0525  TempSrc: Oral  PainSc:                  [name]

## 2017-08-12 NOTE — Progress Notes (Signed)
Reviewed all patients discharge instructions and handouts regarding postpartum bleeding, no intercourse for 6 weeks, signs and symptoms of mastitis and postpartum bleu's. Reviewed discharge instructions for newborn regarding proper cord care, how and when to bathe the newborn, nail care, proper way to take the baby's temperature, along with safe sleep. All questions have been answered at this time. Patient discharged via wheelchair with nurse tech.  

## 2017-11-11 ENCOUNTER — Emergency Department
Admission: EM | Admit: 2017-11-11 | Discharge: 2017-11-11 | Disposition: A | Discharge status: Home / Self Care | Payer: Medicaid Other | Attending: Emergency Medicine | Admitting: Emergency Medicine

## 2017-11-11 ENCOUNTER — Other Ambulatory Visit: Payer: Self-pay

## 2017-11-11 ENCOUNTER — Emergency Department: Payer: Medicaid Other

## 2017-11-11 ENCOUNTER — Encounter: Payer: Self-pay | Admitting: Emergency Medicine

## 2017-11-11 DIAGNOSIS — R1011 Right upper quadrant pain: Secondary | ICD-10-CM | POA: Diagnosis present

## 2017-11-11 DIAGNOSIS — K8020 Calculus of gallbladder without cholecystitis without obstruction: Secondary | ICD-10-CM

## 2017-11-11 DIAGNOSIS — Z79899 Other long term (current) drug therapy: Secondary | ICD-10-CM | POA: Insufficient documentation

## 2017-11-11 DIAGNOSIS — R1110 Vomiting, unspecified: Secondary | ICD-10-CM

## 2017-11-11 HISTORY — DX: Major depressive disorder, single episode, unspecified: F32.9

## 2017-11-11 HISTORY — DX: Depression, unspecified: F32.A

## 2017-11-11 LAB — COMPREHENSIVE METABOLIC PANEL
ALK PHOS: 53 U/L (ref 38–126)
ALT: 58 U/L — AB (ref 14–54)
AST: 71 U/L — ABNORMAL HIGH (ref 15–41)
Albumin: 3.5 g/dL (ref 3.5–5.0)
Anion gap: 6 (ref 5–15)
BUN: 9 mg/dL (ref 6–20)
CALCIUM: 8.2 mg/dL — AB (ref 8.9–10.3)
CHLORIDE: 112 mmol/L — AB (ref 101–111)
CO2: 25 mmol/L (ref 22–32)
CREATININE: 0.8 mg/dL (ref 0.44–1.00)
GFR calc Af Amer: >60 mL/min (ref >60)
Glucose, Bld: 100 mg/dL — ABNORMAL HIGH (ref 65–99)
Potassium: 2.9 mmol/L — ABNORMAL LOW (ref 3.5–5.1)
Sodium: 143 mmol/L (ref 135–145)
Total Bilirubin: 2.1 mg/dL — ABNORMAL HIGH (ref 0.3–1.2)
Total Protein: 6.4 g/dL — ABNORMAL LOW (ref 6.5–8.1)

## 2017-11-11 LAB — CBC WITH DIFFERENTIAL/PLATELET
BASOS ABS: 0.1 10*3/uL (ref 0–0.1)
Basophils Relative: 1 %
EOS PCT: 1 %
Eosinophils Absolute: 0.1 10*3/uL (ref 0–0.7)
HCT: 33.5 % — ABNORMAL LOW (ref 35.0–47.0)
HEMOGLOBIN: 12.2 g/dL (ref 12.0–16.0)
LYMPHS ABS: 2.1 10*3/uL (ref 1.0–3.6)
LYMPHS PCT: 24 %
MCH: 30.3 pg (ref 26.0–34.0)
MCHC: 36.3 g/dL — ABNORMAL HIGH (ref 32.0–36.0)
MCV: 83.3 fL (ref 80.0–100.0)
Monocytes Absolute: 0.5 10*3/uL (ref 0.2–0.9)
Monocytes Relative: 6 %
NEUTROS ABS: 6.1 10*3/uL (ref 1.4–6.5)
NEUTROS PCT: 68 %
PLATELETS: 236 10*3/uL (ref 150–440)
RBC: 4.02 MIL/uL (ref 3.80–5.20)
RDW: 15.2 % — ABNORMAL HIGH (ref 11.5–14.5)
WBC: 8.7 10*3/uL (ref 3.6–11.0)

## 2017-11-11 LAB — LIPASE, BLOOD: Lipase: 41 U/L (ref 11–51)

## 2017-11-11 MED ORDER — ONDANSETRON HCL 4 MG/2ML IJ SOLN
INTRAMUSCULAR | Status: AC
  Administered 2017-11-11: 4 mg via INTRAVENOUS
  Filled 2017-11-11: qty 2

## 2017-11-11 MED ORDER — POTASSIUM CHLORIDE 20 MEQ PO PACK
40.0000 meq | PACK | Freq: Two times a day (BID) | ORAL | Status: DC
  Administered 2017-11-11: 40 meq via ORAL

## 2017-11-11 MED ORDER — MORPHINE SULFATE (PF) 2 MG/ML IV SOLN
INTRAVENOUS | Status: AC
  Administered 2017-11-11: 2 mg via INTRAVENOUS
  Filled 2017-11-11: qty 1

## 2017-11-11 MED ORDER — MORPHINE SULFATE (PF) 4 MG/ML IV SOLN
4.0000 mg | Freq: Once | INTRAVENOUS | Status: AC
Start: 2017-11-11 — End: 2017-11-11
  Administered 2017-11-11: 4 mg via INTRAVENOUS

## 2017-11-11 MED ORDER — ONDANSETRON HCL 4 MG/2ML IJ SOLN
INTRAMUSCULAR | Status: AC
  Filled 2017-11-11: qty 2

## 2017-11-11 MED ORDER — OXYCODONE-ACETAMINOPHEN 5-325 MG PO TABS: 1.0000 | ORAL_TABLET | ORAL | 0 refills | Status: AC | PRN

## 2017-11-11 MED ORDER — MORPHINE SULFATE (PF) 4 MG/ML IV SOLN
INTRAVENOUS | Status: AC
  Filled 2017-11-11: qty 1

## 2017-11-11 MED ORDER — MORPHINE SULFATE (PF) 2 MG/ML IV SOLN
2.0000 mg | Freq: Once | INTRAVENOUS | Status: AC
  Administered 2017-11-11: 2 mg via INTRAVENOUS

## 2017-11-11 MED ORDER — SODIUM CHLORIDE 0.9 % IV BOLUS
1000.0000 mL | Freq: Once | INTRAVENOUS | Status: AC
  Administered 2017-11-11: 1000 mL via INTRAVENOUS

## 2017-11-11 MED ORDER — POTASSIUM CHLORIDE 20 MEQ PO PACK
PACK | ORAL | Status: AC
  Filled 2017-11-11: qty 2

## 2017-11-11 MED ORDER — ONDANSETRON 4 MG PO TBDP: 4.0000 mg | ORAL_TABLET | Freq: Three times a day (TID) | ORAL | 0 refills | Status: AC | PRN

## 2017-11-11 MED ORDER — ONDANSETRON HCL 4 MG/2ML IJ SOLN
4.0000 mg | Freq: Once | INTRAMUSCULAR | Status: AC
  Administered 2017-11-11: 4 mg via INTRAVENOUS

## 2017-11-11 NOTE — ED Notes (Signed)
Pt discharged home after verbalizing understanding of discharge instructions; nad noted. 

## 2017-11-11 NOTE — ED Notes (Signed)
Patient denies increased pain after eating and drinking. MD made aware.

## 2017-11-11 NOTE — ED Triage Notes (Signed)
Patient from home via [facility]. Reports she woke up with sudden onset RUQ abdominal pain and one episode of vomiting. Patient denies recent illness or being around anyone sick. Denies fever. Alert and oriented x4.

## 2017-11-11 NOTE — ED Provider Notes (Signed)
[facility] Emergency Department Provider Note   First MD Initiated Contact with Patient 11/11/17 [phone]     (approximate)  I have reviewed the triage vital signs and the nursing notes.   HISTORY  Chief Complaint Abdominal Pain    HPI Nichole Mcguire is a 22 y.o. female presents to the emergency department [facility] [place] with right upper quadrant abdominal pain with one episode of nonbloody emesis.  Patient denies any fever or diarrhea.  Patient denies any urinary symptoms.  States her current pain score is 8 out of 10.   Past Medical History:  Diagnosis Date  . Depression     Patient Active Problem List   Diagnosis Date Noted  . Indication for care in labor and delivery, antepartum 08/10/2017    History reviewed. No pertinent surgical history.  Prior to Admission medications   Medication Sig Start Date End Date Taking? Authorizing Provider  acetaminophen (TYLENOL) 500 MG tablet Take 2 tablets (1,000 mg total) by mouth every 6 (six) hours as needed (for pain scale < 4). 08/12/17   McVey, Prudencio Pairebecca A, CNM  ibuprofen (ADVIL,MOTRIN) 600 MG tablet Take 1 tablet (600 mg total) by mouth every 6 (six) hours. 08/12/17   McVey, Prudencio Pairebecca A, CNM  ondansetron (ZOFRAN ODT) 4 MG disintegrating tablet Take 1 tablet (4 mg total) by mouth every 8 (eight) hours as needed for nausea or vomiting. 11/11/17   Darci CurrentBrown, [place] N, MD  oxyCODONE-acetaminophen (PERCOCET) 5-325 MG tablet Take 1 tablet by mouth every 4 (four) hours as needed for severe pain. 11/11/17 11/11/18  Darci CurrentBrown, [place] N, MD  Prenatal Vit-Fe Fumarate-FA (PRENATAL VITAMIN PO) Take by mouth.    [provider]    Allergies Other  Family History  Problem Relation Age of Onset  . Hypertension Mother   . Crohn's disease Mother     Social History Social History   Tobacco Use  . Smoking status: Never Smoker  . Smokeless tobacco: Never Used  Substance Use Topics  . Alcohol use: No  . Drug use: No     Review of Systems Constitutional: No fever/chills Eyes: No visual changes. ENT: No sore throat. Cardiovascular: Denies chest pain. Respiratory: Denies shortness of breath. Gastrointestinal: Positive for abdominal pain and vomiting no diarrhea.  No constipation. Genitourinary: Negative for dysuria. Musculoskeletal: Negative for neck pain.  Negative for back pain. Integumentary: Negative for rash. Neurological: Negative for headaches, focal weakness or numbness.  ____________________________________________   PHYSICAL EXAM:  VITAL SIGNS: ED Triage Vitals  Enc Vitals Group     BP 11/11/17 0427 (!) 142/93     Pulse Rate 11/11/17 0427 68     Resp --      Temp 11/11/17 0430 (!) 97.5 F (36.4 C)     Temp Source 11/11/17 0430 Oral     SpO2 11/11/17 0427 100 %     Weight 11/11/17 0426 81.6 kg (180 lb)     Height 11/11/17 0426 1.676 m (5\' 6" )     Head Circumference --      Peak Flow --      Pain Score 11/11/17 0426 9     Pain Loc --      Pain Edu? --      Excl. in GC? --     Constitutional: Alert and oriented. Well appearing and in no acute distress. Eyes: Conjunctivae are normal.  Head: Atraumatic.ea. Mouth/Throat: Mucous membranes are moist.  Oropharynx non-erythematous. Neck: No stridor.  Cardiovascular: Normal rate, regular  rhythm. Good peripheral circulation. Grossly normal heart sounds. Respiratory: Normal respiratory effort.  No retractions. Lungs CTAB. Gastrointestinal: Right upper quadrant tenderness to palpation no distention.  Musculoskeletal: No lower extremity tenderness nor edema. No gross deformities of extremities. Neurologic:  Normal speech and language. No gross focal neurologic deficits are appreciated.  Skin:  Skin is warm, dry and intact. No rash noted. Psychiatric: Mood and affect are normal. Speech and behavior are normal.  ____________________________________________   LABS (all labs ordered are listed, but only abnormal results are  displayed)  Labs Reviewed  CBC WITH DIFFERENTIAL/PLATELET - Abnormal; Notable for the following components:      Result Value   HCT 33.5 (*)    MCHC 36.3 (*)    RDW 15.2 (*)    All other components within normal limits  COMPREHENSIVE METABOLIC PANEL - Abnormal; Notable for the following components:   Potassium 2.9 (*)    Chloride 112 (*)    Glucose, Bld 100 (*)    Calcium 8.2 (*)    Total Protein 6.4 (*)    AST 71 (*)    ALT 58 (*)    Total Bilirubin 2.1 (*)    All other components within normal limits  LIPASE, BLOOD     RADIOLOGY I, [place] Shanon Seawright, personally viewed and evaluated these images (plain radiographs) as part of my medical decision making, as well as reviewing the written report by the radiologist.  ED MD interpretation: Right upper quadrant ultrasound consistent with cholelithiasis  Official radiology report(s): No results found.    Procedures   ____________________________________________   INITIAL IMPRESSION / ASSESSMENT AND PLAN / ED COURSE  As part of my medical decision making, I reviewed the following data within the electronic MEDICAL RECORD NUMBER   22 year old female presenting with above-stated history and physical exam concerning for cholelithiasis versus cholecystitis.  As such right upper quadrant ultrasound was performed which revealed evidence of cholelithiasis.  Patient given IV morphine and Zofran in the emergency department complete resolution of pain and vomiting.  Spoke with the patient at length regarding management of cholelithiasis.  In addition patient was advised of warning signs that would warrant immediate return to the emergency department.  Patient will be prescribed Percocet and Zofran for home with recommendation for outpatient follow-up with general surgery ____________________________________________  FINAL CLINICAL IMPRESSION(S) / ED DIAGNOSES  Final diagnoses:  Vomiting  RUQ pain  Calculus of gallbladder without  cholecystitis without obstruction     MEDICATIONS GIVEN DURING THIS VISIT:  Medications  morphine 2 MG/ML injection 2 mg (2 mg Intravenous Given 11/11/17 0431)  ondansetron (ZOFRAN) injection 4 mg (4 mg Intravenous Given 11/11/17 0431)  sodium chloride 0.9 % bolus 1,000 mL (0 mLs Intravenous Stopped 11/11/17 0645)  morphine 4 MG/ML injection 4 mg (4 mg Intravenous Given 11/11/17 0536)     ED Discharge Orders        Ordered    oxyCODONE-acetaminophen (PERCOCET) 5-325 MG tablet  Every 4 hours PRN     11/11/17 0647    ondansetron (ZOFRAN ODT) 4 MG disintegrating tablet  Every 8 hours PRN     11/11/17 0647       Note:  This document was prepared using Dragon voice recognition software and may include unintentional dictation errors.    Darci Current, MD 11/13/17 [phone]

## 2017-11-11 NOTE — ED Notes (Signed)
Patient given crackers and ginger ale for PO challenge. Will continue to monitor for abdominal pain and N/V.

## 2017-11-17 ENCOUNTER — Ambulatory Visit: Payer: Self-pay | Admitting: Surgery

## 2017-11-22 ENCOUNTER — Ambulatory Visit: Payer: Self-pay | Admitting: Surgery

## 2018-04-06 ENCOUNTER — Encounter: Payer: Self-pay | Admitting: *Deleted

## 2019-01-03 IMAGING — US US OB LIMITED
1 series · 14 of 28 positions shown · non-contrast
Comparison: none

CLINICAL DATA: Pregnant patient with vaginal bleeding for 1 day.
Lower abdominal pain.

EXAM:
LIMITED OBSTETRIC ULTRASOUND

[Series 1: us ob limited · 0.25mm/px · 14 of 41 slices shown]
[im 2/41]
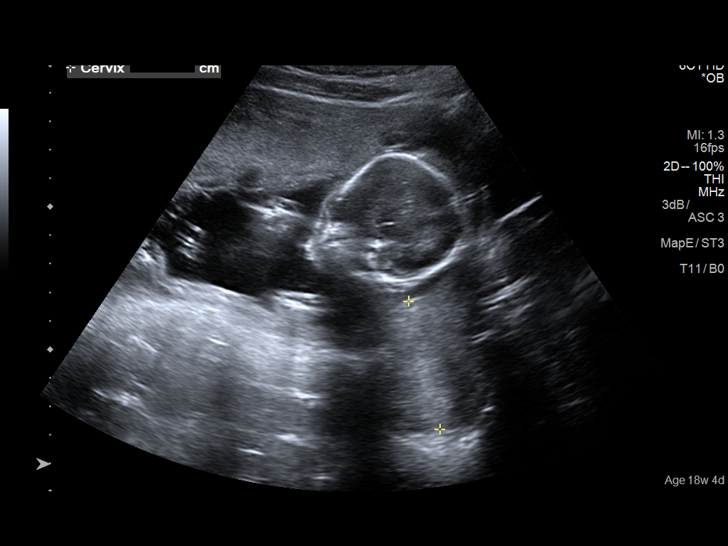
[im 5/41]
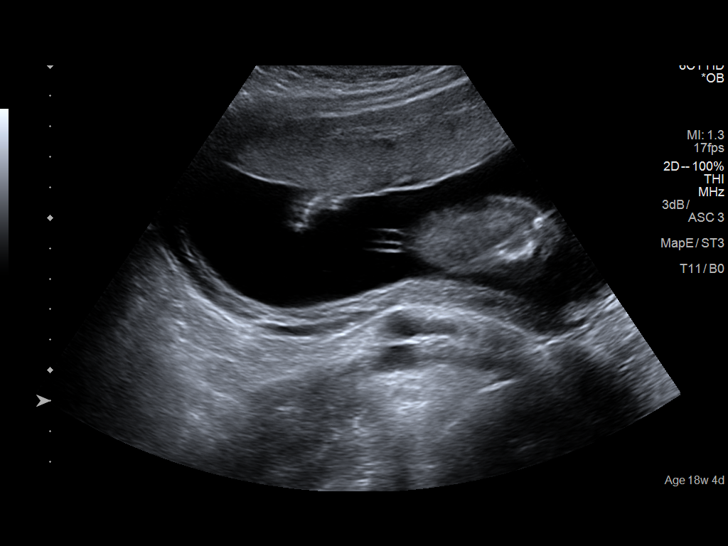
[im 8/41]
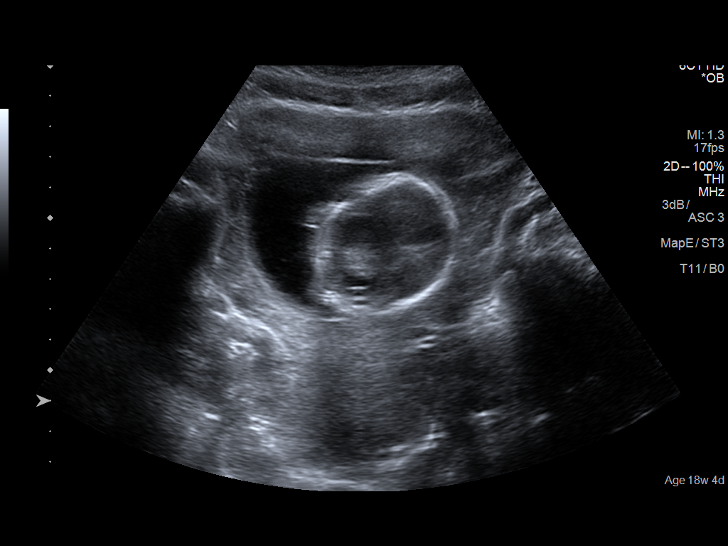
[im 11/41]
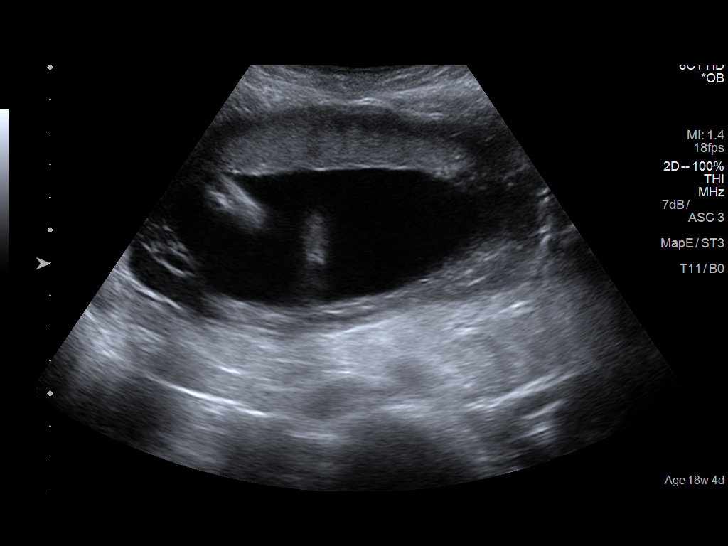
[im 14/41]
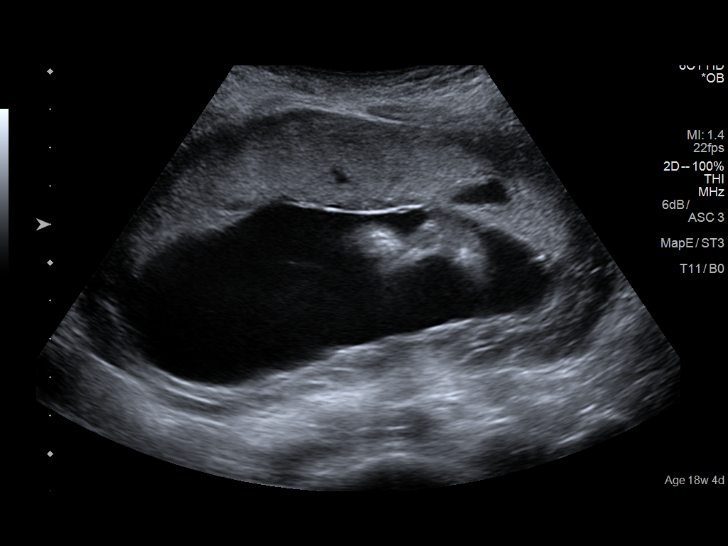
[im 17/41]
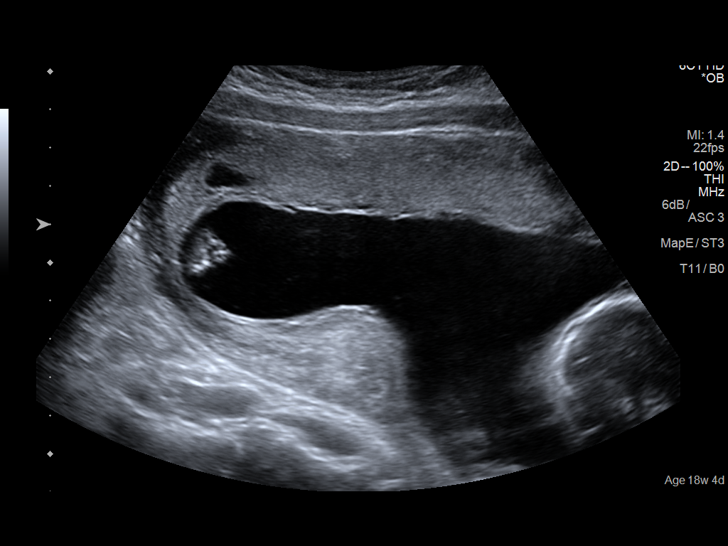
[im 20/41]
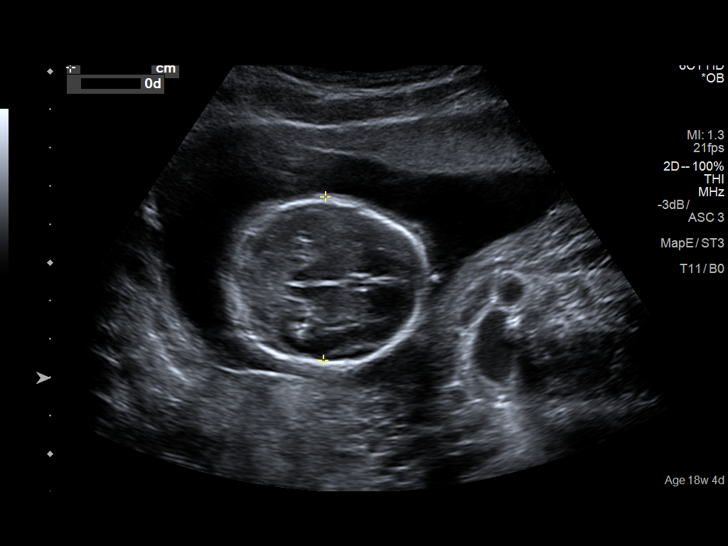
[im 23/41]
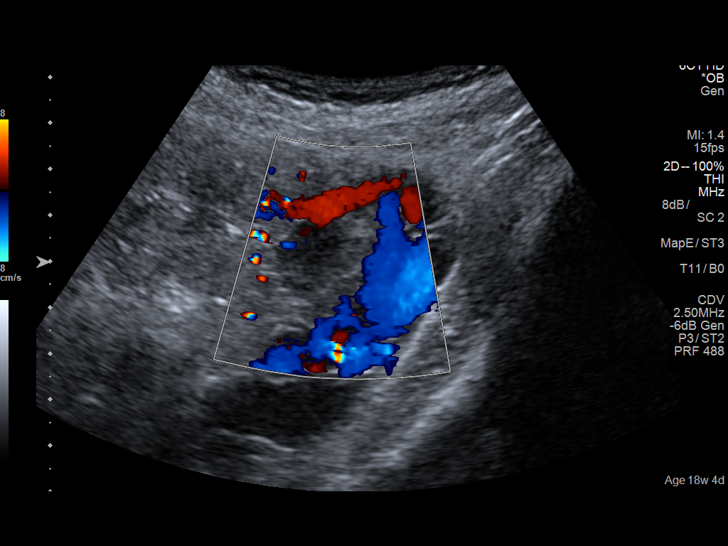
[im 26/41]
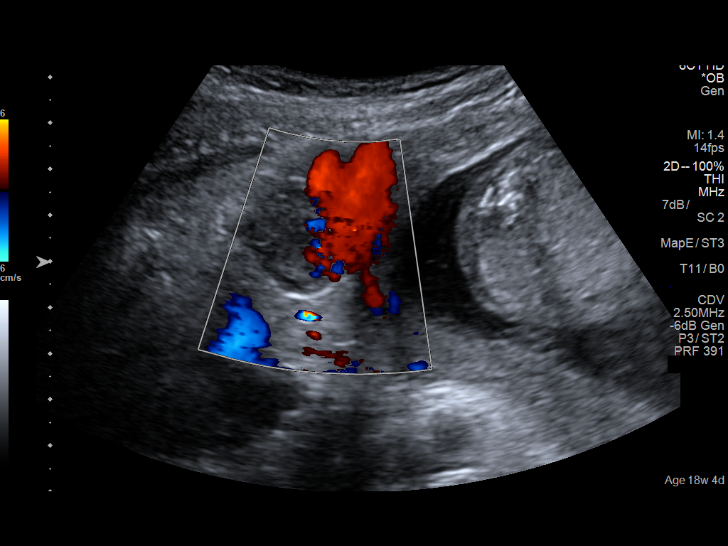
[im 29/41]
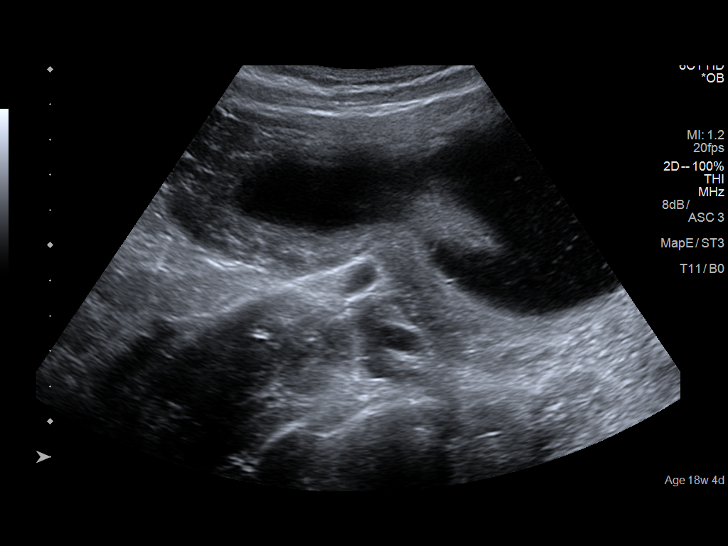
[im 32/41]
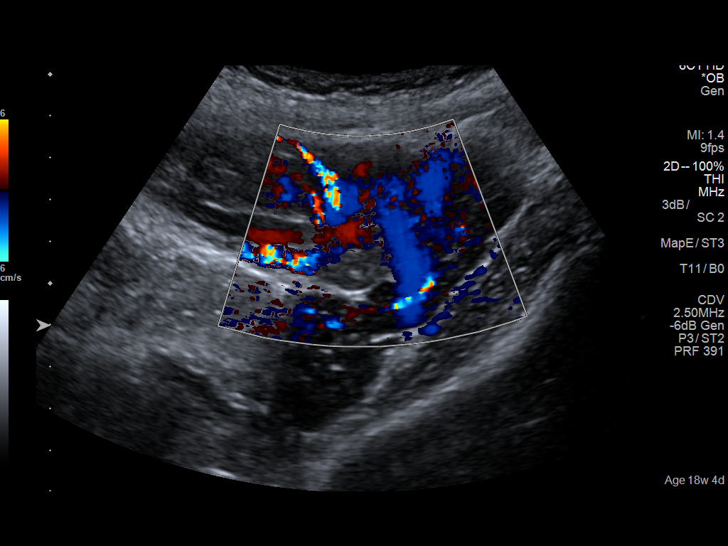
[im 35/41]
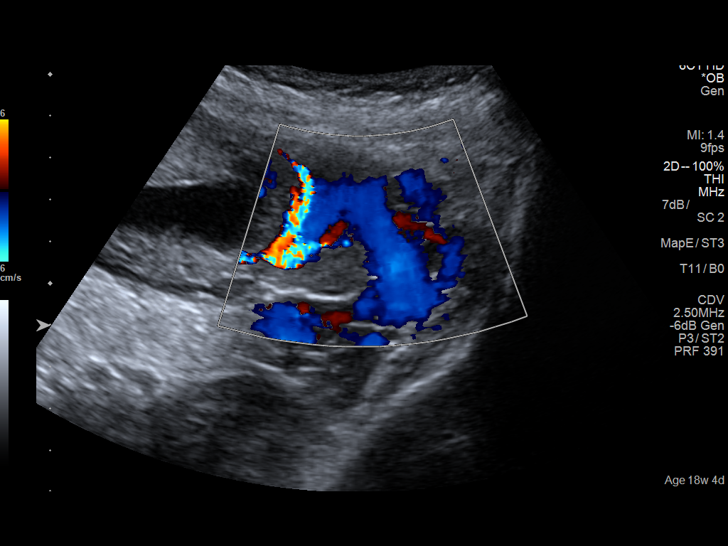
[im 38/41]
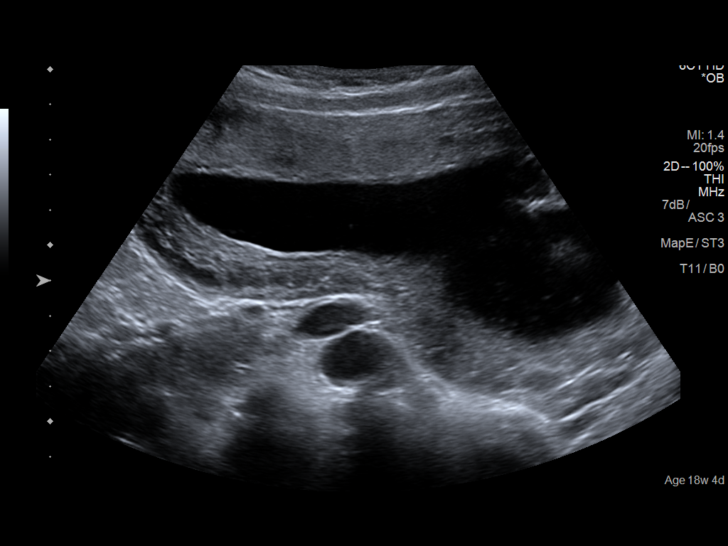
[im 41/41]
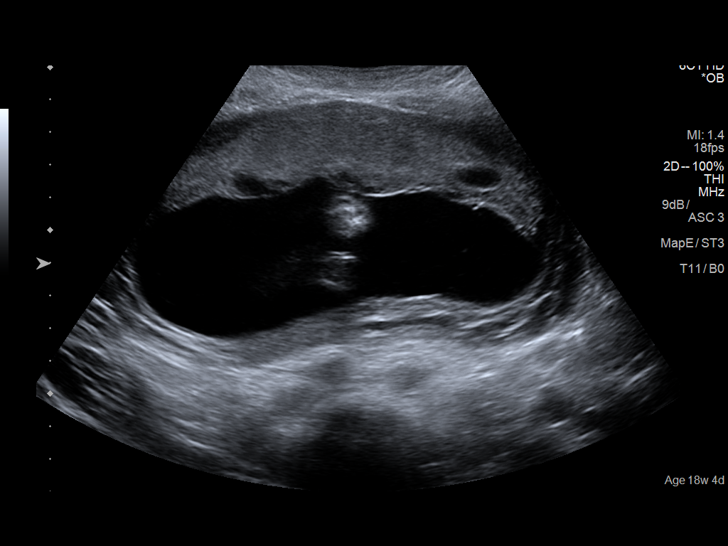

[14 of 28 positions shown; findings below may reference images not displayed]

FINDINGS: Number of Fetuses: 1

Heart Rate:  157 bpm

Movement: Yes

Presentation: Cephalic

Placental Location: Anterior

Previa: No

Amniotic Fluid (Subjective):  Within normal limits.

BPD:  4.3cm 19w  0d

MATERNAL FINDINGS:

Cervix:  Appears closed.

Uterus/Adnexae: No abnormality visualized.
IMPRESSION: Single live intrauterine gestation.  No acute process identified.

This exam is performed on an emergent basis and does not
comprehensively evaluate fetal size, dating, or anatomy; follow-up
complete OB US should be considered if further fetal assessment is
warranted.

## 2019-09-10 IMAGING — US US ABDOMEN LIMITED
1 series · 14 of 25 positions shown · non-contrast
Comparison: None.

CLINICAL DATA: Right upper quadrant pain and vomiting for 3 hours.

EXAM:
ULTRASOUND ABDOMEN LIMITED RIGHT UPPER QUADRANT

[Series 1: us abdomen limited · 0.26mm/px · 14 of 57 slices shown]
[im 1/57]
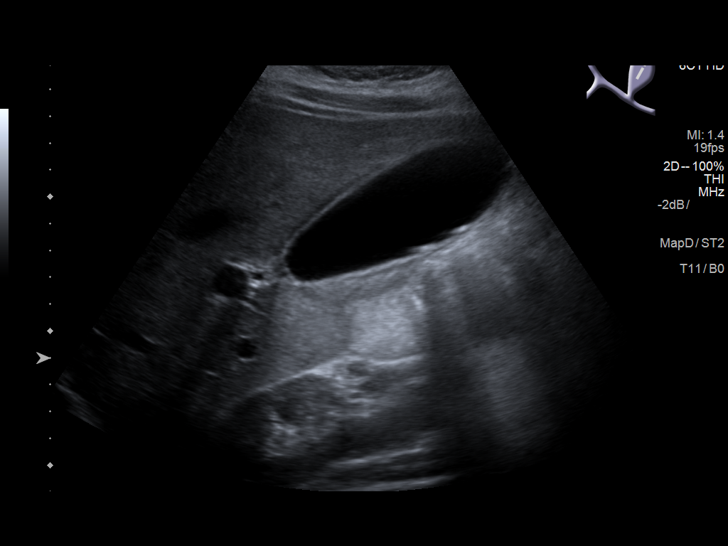
[im 5/57]
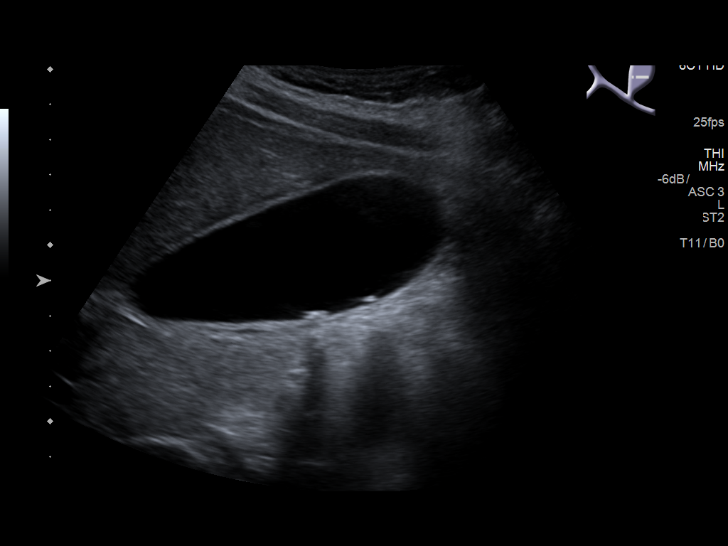
[im 10/57]
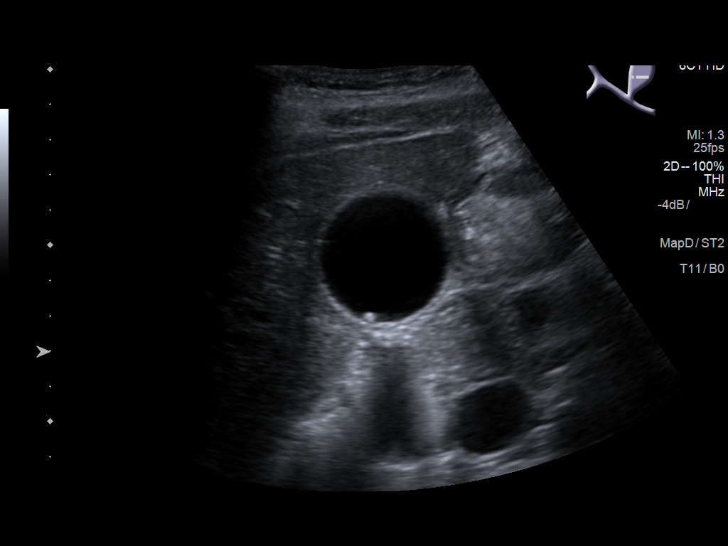
[im 15/57]
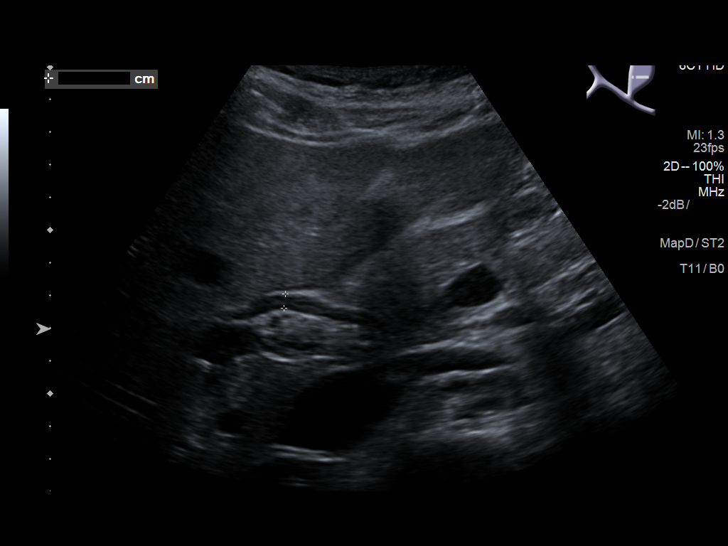
[im 19/57]
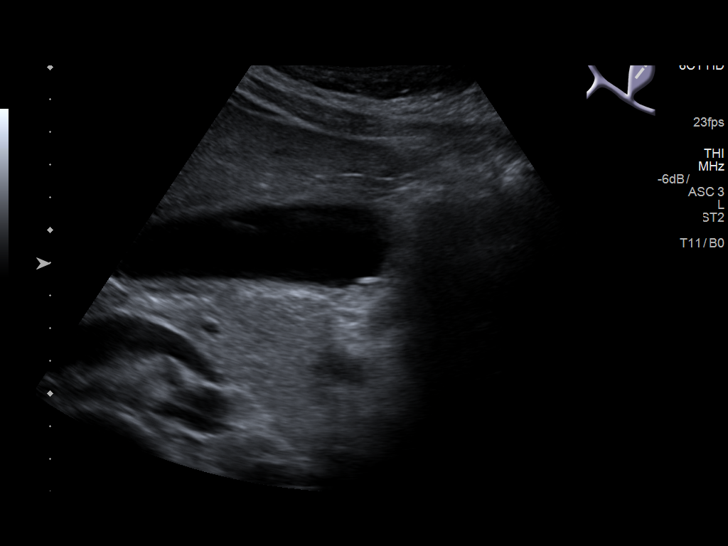
[im 22/57]
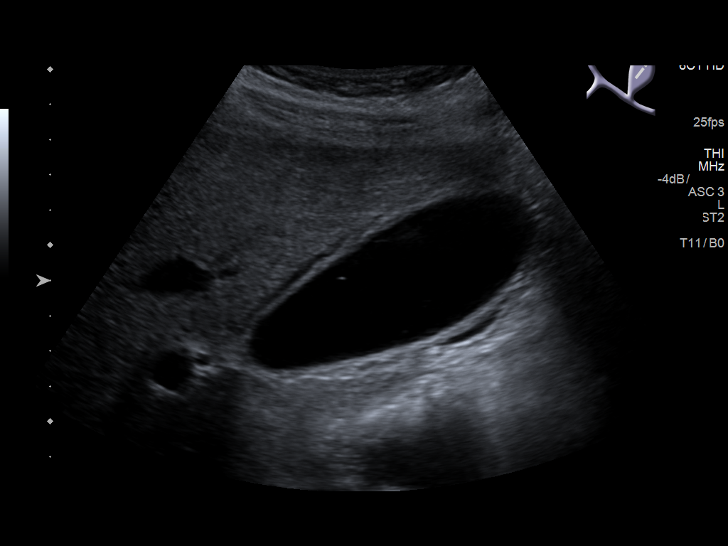
[im 26/57]
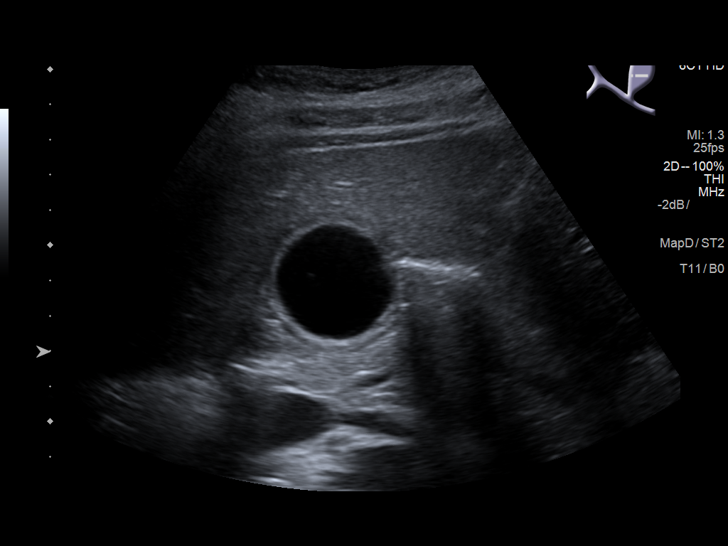
[im 31/57]
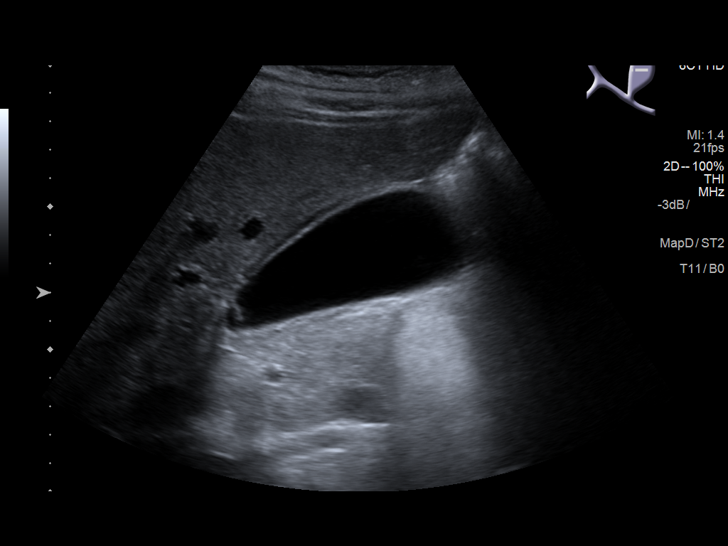
[im 36/57]
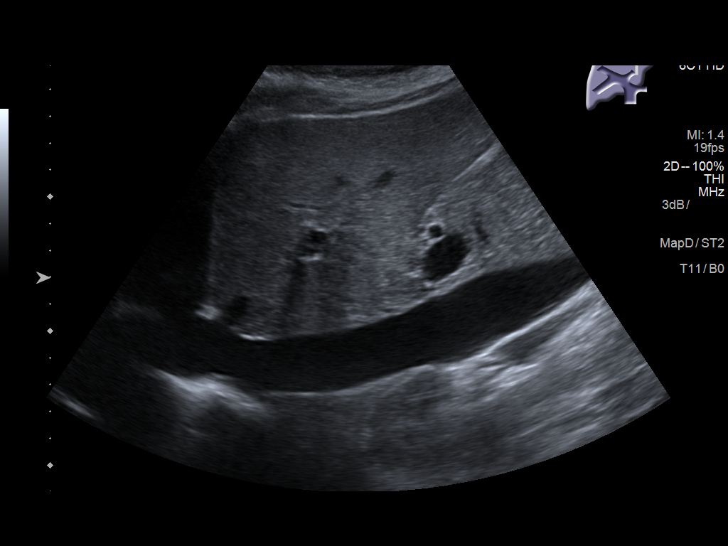
[im 38/57]
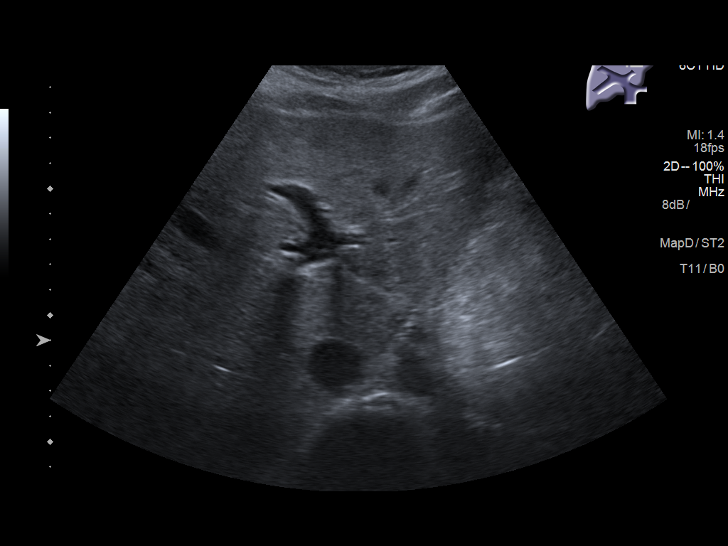
[im 43/57]
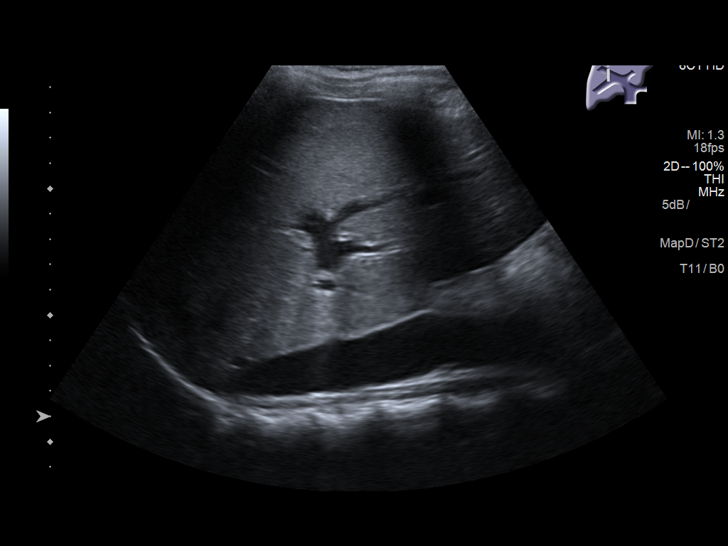
[im 47/57]
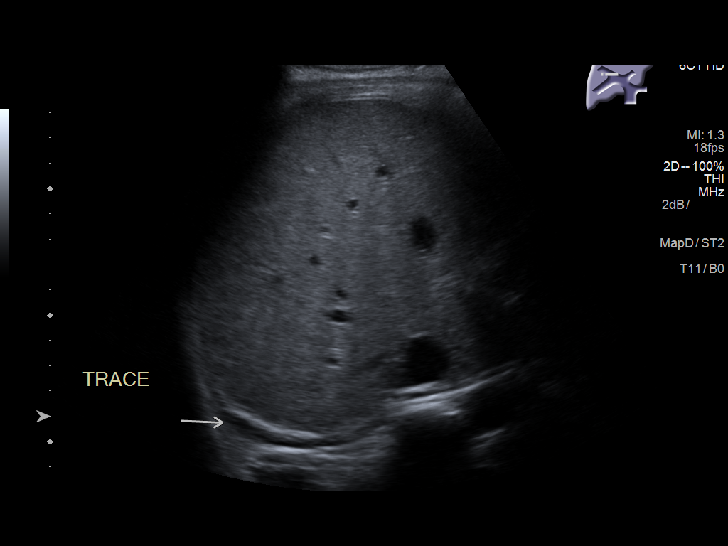
[im 52/57]
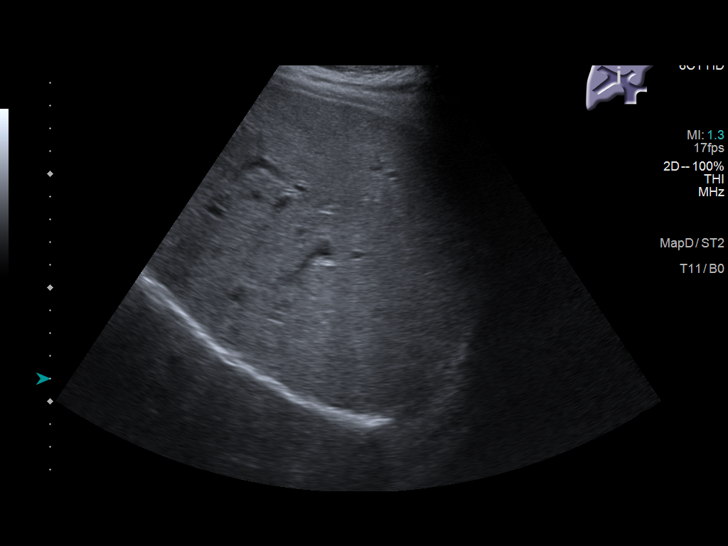
[im 57/57]
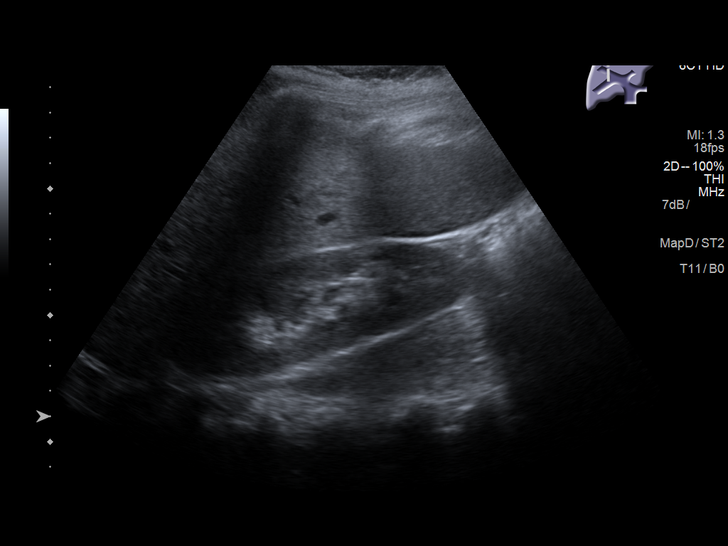

[14 of 25 positions shown; findings below may reference images not displayed]

FINDINGS: Gallbladder:

Small stones layering in the gallbladder. Largest measures about 7
mm diameter. Gallbladder wall is not thickened but there is a small
amount of pericholecystic fluid. Murphy's sign is negative.

Common bile duct:

Diameter: 4.4 mm, normal

Liver:

No focal lesion identified. Within normal limits in parenchymal
echogenicity. Portal vein is patent on color Doppler imaging with
normal direction of blood flow towards the liver.
IMPRESSION: Cholelithiasis with mild pericholecystic edema. No wall thickening
and negative Murphy's sign decreases likelihood of cholecystitis.
